# Patient Record
Sex: Female | Born: 1990 | Race: Black or African American | Hispanic: No | Marital: Married | Smoking: Never smoker
Health system: Southern US, Community
[De-identification: ages and names within clinical notes are randomized; demographics above are authoritative.]

## PROBLEM LIST (undated history)

## (undated) DIAGNOSIS — B009 Herpesviral infection, unspecified: Secondary | ICD-10-CM

## (undated) HISTORY — PX: INDUCED ABORTION: SHX677

---

## 2014-08-06 ENCOUNTER — Ambulatory Visit (INDEPENDENT_AMBULATORY_CARE_PROVIDER_SITE_OTHER): Payer: BC Managed Care – PPO | Admitting: Family Medicine

## 2014-08-06 VITALS — BP 104/72 | HR 85 | Temp 98.9°F | Resp 17 | Ht 63.0 in | Wt 117.0 lb

## 2014-08-06 DIAGNOSIS — N760 Acute vaginitis: Secondary | ICD-10-CM

## 2014-08-06 LAB — POCT WET PREP WITH KOH
KOH Prep POC: NEGATIVE
Trichomonas, UA: NEGATIVE
Yeast Wet Prep HPF POC: NEGATIVE

## 2014-08-06 MED ORDER — FLUCONAZOLE 150 MG PO TABS: 150.0000 mg | ORAL_TABLET | Freq: Once | ORAL | Status: DC

## 2014-08-06 NOTE — Progress Notes (Signed)
° °  Subjective:    Patient ID: Melanie Huang, female    DOB: [birth year], 23 y.o.   MRN: [id number]  HPI Chief Complaint  Patient presents with   Vaginal Discharge    THINKS SHE HAS A YEAST INFECTION   This chart was scribed for Elvina SidleKurt Lauenstein, MD by Andrew Auaven Small, ED Scribe. This patient was seen in room 4 and the patient's care was started at 8:24 PM.  HPI Comments: Melanie Huang is a 23 y.o. female who presents to the [facility] complaining of a possible yeast infection. Pt reports vaginal irritation and a slight vaginal discharge. Pt is on birth control and denies the chance of being pregnant.  Pt denies dysuria, fever, chills, vaginal burning. Pt does want to an STD screening.   History reviewed. No pertinent past medical history. No Known Allergies Prior to Admission medications   Not on File   Review of Systems  Constitutional: Negative for fever and chills.  Genitourinary: Positive for vaginal discharge. Negative for dysuria, difficulty urinating and vaginal pain.       Vaginal irritation.    Objective:   Physical Exam  Constitutional: She is oriented to person, place, and time. She appears well-developed and well-nourished. No distress.  HENT:  Head: Normocephalic and atraumatic.  Eyes: Conjunctivae and EOM are normal.  Neck: Neck supple.  Cardiovascular: Normal rate.   Pulmonary/Chest: Effort normal.  Musculoskeletal: Normal range of motion.  Neurological: She is alert and oriented to person, place, and time.  Skin: Skin is warm and dry.  Psychiatric: She has a normal mood and affect. Her behavior is normal.  Nursing note and vitals reviewed.  Results for orders placed or performed in visit on 08/06/14  POCT Wet Prep with KOH  Result Value Ref Range   Trichomonas, UA Negative    Clue Cells Wet Prep HPF POC 0-2    Epithelial Wet Prep HPF POC 2-4    Yeast Wet Prep HPF POC neg    Bacteria Wet Prep HPF POC small    RBC Wet Prep HPF POC 0-1    WBC  Wet Prep HPF POC 4-8    KOH Prep POC Negative    Pelvic:  NEFG, lumpy vag discharge, normal cervix without CMT  Vaginitis and vulvovaginitis - Plan: POCT Wet Prep with KOH, GC/Chlamydia Probe Amp, HIV antibody (with reflex), RPR, fluconazole (DIFLUCAN) 150 MG tablet  Signed, Elvina SidleKurt Lauenstein, MD      Assessment & Plan:   This chart was scribed in my presence and reviewed by me personally. Vaginitis and vulvovaginitis - Plan: POCT Wet Prep with KOH, GC/Chlamydia Probe Amp, HIV antibody (with reflex), RPR, fluconazole (DIFLUCAN) 150 MG tablet  Elvina SidleKurt Lauenstein, MD

## 2014-08-07 LAB — RPR

## 2014-08-07 LAB — HIV ANTIBODY (ROUTINE TESTING W REFLEX): HIV 1&2 Ab, 4th Generation: NONREACTIVE

## 2014-08-08 LAB — GC/CHLAMYDIA PROBE AMP
CT Probe RNA: NEGATIVE
GC Probe RNA: NEGATIVE

## 2014-11-13 ENCOUNTER — Ambulatory Visit (INDEPENDENT_AMBULATORY_CARE_PROVIDER_SITE_OTHER): Payer: BC Managed Care – PPO | Admitting: Physician Assistant

## 2014-11-13 VITALS — BP 100/60 | HR 87 | Temp 98.1°F | Resp 18 | Ht 63.75 in | Wt 118.0 lb

## 2014-11-13 DIAGNOSIS — B3731 Acute candidiasis of vulva and vagina: Secondary | ICD-10-CM

## 2014-11-13 DIAGNOSIS — B373 Candidiasis of vulva and vagina: Secondary | ICD-10-CM

## 2014-11-13 DIAGNOSIS — N760 Acute vaginitis: Secondary | ICD-10-CM | POA: Diagnosis not present

## 2014-11-13 DIAGNOSIS — A499 Bacterial infection, unspecified: Secondary | ICD-10-CM | POA: Diagnosis not present

## 2014-11-13 DIAGNOSIS — B9689 Other specified bacterial agents as the cause of diseases classified elsewhere: Secondary | ICD-10-CM

## 2014-11-13 LAB — POCT WET PREP WITH KOH
KOH PREP POC: POSITIVE
Trichomonas, UA: NEGATIVE
Yeast Wet Prep HPF POC: POSITIVE

## 2014-11-13 MED ORDER — FLUCONAZOLE 150 MG PO TABS: 150.0000 mg | ORAL_TABLET | Freq: Once | ORAL | Status: DC

## 2014-11-13 MED ORDER — METRONIDAZOLE 500 MG PO TABS: 500.0000 mg | ORAL_TABLET | Freq: Three times a day (TID) | ORAL | Status: DC

## 2014-11-13 NOTE — Progress Notes (Signed)
11/13/2014 at 2:34 PM  [place] Uballe / DOB: [birth year] / MRN: [id number]  The patient  does not have a problem list on file.  SUBJECTIVE  Chief compalaint: Vaginal Itching and Vaginal Discharge   History of present illness: Ms. Vannest is 24 y.o. well appearing female presenting for vaginal irritation which she states is moderate. This started 5 days ago and is improving . Associated symptoms include white discharge and vaginal itching.  She denies odor, abdominal and suprapubic pain. Hydrogen peroxide aggravates her symptoms, and monostat alleviates them. She does report a history of chlamydia in 2008.  She had an yeast infection in December along with a normal STI panel at that time. She takes birth control daily. She denies any new partners since her STI testing in December.      She  has no past medical history on file.    She has a current medication list which includes the following prescription(s): fluconazole.  Ms. Berhow has No Known Allergies. She  reports that she has never smoked. She has never used smokeless tobacco. She reports that she drinks alcohol. She reports that she does not use illicit drugs. She  has no sexual activity history on file. The patient  has no past surgical history on file.  Her family history includes Hypertension in her father.  Review of Systems  Constitutional: Negative.   HENT: Negative.   Eyes: Negative.   Respiratory: Negative.   Cardiovascular: Negative.   Gastrointestinal: Negative for nausea and abdominal pain.  Genitourinary: Negative for dysuria, urgency, frequency and hematuria.  Skin: Positive for itching. Negative for rash.  Neurological: Negative for dizziness.    OBJECTIVE  Her  height is 5' 3.75" (1.619 m) and weight is 118 lb (53.524 kg). Her oral temperature is 98.1 F (36.7 C). Her blood pressure is 100/60 and her pulse is 87. Her respiration is 18 and oxygen saturation is 100%.  The patient's body mass index is 20.42  kg/(m^2).  Physical Exam  Constitutional: She is oriented to person, place, and time. She appears well-developed and well-nourished.  Cardiovascular: Normal rate and regular rhythm.   Respiratory: Effort normal and breath sounds normal.  GI: Soft. Bowel sounds are normal. There is no tenderness.  Genitourinary: Uterus normal.    Cervix exhibits no motion tenderness, no discharge and no friability. Right adnexum displays no mass, no tenderness and no fullness. Left adnexum displays no mass, no tenderness and no fullness.    Neurological: She is alert and oriented to person, place, and time.  Skin: Skin is warm and dry. No pallor.  Psychiatric: She has a normal mood and affect.    Results for orders placed or performed in visit on 11/13/14 (from the past 24 hour(s))  POCT Wet Prep with KOH     Status: None   Collection Time: 11/13/14  2:25 PM  Result Value Ref Range   Trichomonas, UA Negative    Clue Cells Wet Prep HPF POC 3-6    Epithelial Wet Prep HPF POC 16-24    Yeast Wet Prep HPF POC pos    Bacteria Wet Prep HPF POC 4+    RBC Wet Prep HPF POC 3-8    WBC Wet Prep HPF POC 1-4    KOH Prep POC Positive     ASSESSMENT & PLAN  [place] was seen today for vaginal itching and vaginal discharge.  Diagnoses and all orders for this visit:  Vaginitis and vulvovaginitis: Patient positive for both yeast and BV.  Will treat with the below.  Will hold on STI testing for now given normal results on 08/06/14.  Patient will return if her symptoms do not improve with the treatment plan.   Orders: -     POCT Wet Prep with KOH  Vaginal yeast infection Orders: -     fluconazole (DIFLUCAN) 150 MG tablet; Take 1 tablet (150 mg total) by mouth once. Repeat if needed  BV (bacterial vaginosis) Orders: -     metroNIDAZOLE (FLAGYL) 500 MG tablet; Take 1 tablet (500 mg total) by mouth 3 (three) times daily.    The patient was advised to call or come back to clinic if she does not see an  improvement in symptoms, or worsens with the above plan.   Deliah BostonMichael Clark, MHS, PA-C [facility] 11/13/2014 2:34 PM

## 2015-03-07 ENCOUNTER — Ambulatory Visit (INDEPENDENT_AMBULATORY_CARE_PROVIDER_SITE_OTHER): Payer: BC Managed Care – PPO | Admitting: Emergency Medicine

## 2015-03-07 VITALS — BP 118/60 | HR 83 | Temp 98.3°F | Resp 18 | Ht 63.5 in | Wt 118.6 lb

## 2015-03-07 DIAGNOSIS — A499 Bacterial infection, unspecified: Secondary | ICD-10-CM

## 2015-03-07 DIAGNOSIS — N898 Other specified noninflammatory disorders of vagina: Secondary | ICD-10-CM

## 2015-03-07 DIAGNOSIS — N760 Acute vaginitis: Secondary | ICD-10-CM | POA: Diagnosis not present

## 2015-03-07 DIAGNOSIS — B9689 Other specified bacterial agents as the cause of diseases classified elsewhere: Secondary | ICD-10-CM

## 2015-03-07 LAB — POCT WET PREP WITH KOH
KOH Prep POC: NEGATIVE
TRICHOMONAS UA: NEGATIVE
YEAST WET PREP PER HPF POC: NEGATIVE

## 2015-03-07 MED ORDER — METRONIDAZOLE 0.75 % VA GEL: 1.0000 | Freq: Two times a day (BID) | VAGINAL | Status: DC

## 2015-03-07 NOTE — Patient Instructions (Signed)
Bacterial Vaginosis Bacterial vaginosis is a vaginal infection that occurs when the normal balance of bacteria in the vagina is disrupted. It results from an overgrowth of certain bacteria. This is the most common vaginal infection in women of childbearing age. Treatment is important to prevent complications, especially in pregnant women, as it can cause a premature delivery. CAUSES  Bacterial vaginosis is caused by an increase in harmful bacteria that are normally present in smaller amounts in the vagina. Several different kinds of bacteria can cause bacterial vaginosis. However, the reason that the condition develops is not fully understood. RISK FACTORS Certain activities or behaviors can put you at an increased risk of developing bacterial vaginosis, including:  Having a new sex partner or multiple sex partners.  Douching.  Using an intrauterine device (IUD) for contraception. Women do not get bacterial vaginosis from toilet seats, bedding, swimming pools, or contact with objects around them. SIGNS AND SYMPTOMS  Some women with bacterial vaginosis have no signs or symptoms. Common symptoms include:  Grey vaginal discharge.  A fishlike odor with discharge, especially after sexual intercourse.  Itching or burning of the vagina and vulva.  Burning or pain with urination. DIAGNOSIS  Your health care provider will take a medical history and examine the vagina for signs of bacterial vaginosis. A sample of vaginal fluid may be taken. Your health care provider will look at this sample under a microscope to check for bacteria and abnormal cells. A vaginal pH test may also be done.  TREATMENT  Bacterial vaginosis may be treated with antibiotic medicines. These may be given in the form of a pill or a vaginal cream. A second round of antibiotics may be prescribed if the condition comes back after treatment.  HOME CARE INSTRUCTIONS   Only take over-the-counter or prescription medicines as  directed by your health care provider.  If antibiotic medicine was prescribed, take it as directed. Make sure you finish it even if you start to feel better.  Do not have sex until treatment is completed.  Tell all sexual partners that you have a vaginal infection. They should see their health care provider and be treated if they have problems, such as a mild rash or itching.  Practice safe sex by using condoms and only having one sex partner. SEEK MEDICAL CARE IF:   Your symptoms are not improving after 3 days of treatment.  You have increased discharge or pain.  You have a fever. MAKE SURE YOU:   Understand these instructions.  Will watch your condition.  Will get help right away if you are not doing well or get worse. FOR MORE INFORMATION  Centers for Disease Control and Prevention, Division of STD Prevention: www.cdc.gov/std American Sexual Health Association (ASHA): www.ashastd.org  Document Released: 08/17/2005 Document Revised: 06/07/2013 Document Reviewed: 03/29/2013 ExitCare Patient Information 2015 [employer]. This information is not intended to replace advice given to you by your health care provider. Make sure you discuss any questions you have with your health care provider.  

## 2015-03-07 NOTE — Progress Notes (Signed)
Subjective:  Patient ID: Melanie Huang, female    DOB: 1991/05/20  Age: 24 y.o. MRN: [id number]  CC: Vaginitis and Vaginal Discharge   HPI Melanie Huang presents  Vaginal discharge that she describes smelling like her. Never goes away. She denies any fever chills. No dysuria urgency or frequency. No dyspareunia. She has no nausea vomiting or fever or chills. No improvement with over-the-counter medication. She's had prior episodes  bacterial vaginosis.  History Melanie has no past medical history on file.   She has no past surgical history on file.   Her  family history includes Hypertension in her father.  She   reports that she has never smoked. She has never used smokeless tobacco. She reports that she drinks alcohol. She reports that she does not use illicit drugs.  Outpatient Prescriptions Prior to Visit  Medication Sig Dispense Refill  . fluconazole (DIFLUCAN) 150 MG tablet Take 1 tablet (150 mg total) by mouth once. Repeat if needed 2 tablet 0  . metroNIDAZOLE (FLAGYL) 500 MG tablet Take 1 tablet (500 mg total) by mouth 3 (three) times daily. 21 tablet 0   No facility-administered medications prior to visit.    History   Social History  . Marital Status: Single    Spouse Name: N/A  . Number of Children: N/A  . Years of Education: N/A   Social History Main Topics  . Smoking status: Never Smoker   . Smokeless tobacco: Never Used  . Alcohol Use: 0.0 oz/week    0 Standard drinks or equivalent per week     Comment: socially  . Drug Use: No  . Sexual Activity: Not on file   Other Topics Concern  . None   Social History Narrative     Review of Systems  Constitutional: Negative for fever, chills and appetite change.  HENT: Negative for congestion, ear pain, postnasal drip, sinus pressure and sore throat.   Eyes: Negative for pain and redness.  Respiratory: Negative for cough, shortness of breath and wheezing.   Cardiovascular: Negative for leg swelling.    Gastrointestinal: Negative for nausea, vomiting, abdominal pain, diarrhea, constipation and blood in stool.  Endocrine: Negative for polyuria.  Genitourinary: Positive for vaginal discharge. Negative for dysuria, urgency, frequency and flank pain.  Musculoskeletal: Negative for gait problem.  Skin: Negative for rash.  Neurological: Negative for weakness and headaches.  Psychiatric/Behavioral: Negative for confusion and decreased concentration. The patient is not nervous/anxious.     Objective:  BP 118/60 mmHg  Pulse 83  Temp(Src) 98.3 F (36.8 C) (Oral)  Resp 18  Ht 5' 3.5" (1.613 m)  Wt 118 lb 9.6 oz (53.797 kg)  BMI 20.68 kg/m2  SpO2 90%  LMP 02/20/2015  Physical Exam  Constitutional: She is oriented to person, place, and time. She appears well-developed and well-nourished. No distress.  HENT:  Head: Normocephalic and atraumatic.  Right Ear: External ear normal.  Left Ear: External ear normal.  Nose: Nose normal.  Eyes: Conjunctivae and EOM are normal. Pupils are equal, round, and reactive to light. No scleral icterus.  Neck: Normal range of motion. Neck supple. No tracheal deviation present.  Cardiovascular: Normal rate, regular rhythm and normal heart sounds.   Pulmonary/Chest: Effort normal. No respiratory distress. She has no wheezes. She has no rales.  Abdominal: She exhibits no mass. There is no tenderness. There is no rebound and no guarding.  Musculoskeletal: She exhibits no edema.  Lymphadenopathy:    She has no cervical adenopathy.  Neurological: She  is alert and oriented to person, place, and time. Coordination normal.  Skin: Skin is warm and dry. No rash noted.  Psychiatric: She has a normal mood and affect. Her behavior is normal.      Assessment & Plan:   Melanie Huang was seen today for vaginitis and vaginal discharge.  Diagnoses and all orders for this visit:  BV (bacterial vaginosis)  Vaginal discharge Orders: -     POCT Wet Prep with KOH  Other  orders -     metroNIDAZOLE (METROGEL) 0.75 % vaginal gel; Place 1 Applicatorful vaginally 2 (two) times daily.   I am having Ms. Artman start on metroNIDAZOLE. I am also having her maintain her fluconazole and metroNIDAZOLE.  Meds ordered this encounter  Medications  . metroNIDAZOLE (METROGEL) 0.75 % vaginal gel    Sig: Place 1 Applicatorful vaginally 2 (two) times daily.    Dispense:  70 g    Refill:  0    Appropriate red flag conditions were discussed with the patient as well as actions that should be taken.  Patient expressed his understanding.  Follow-up: Return if symptoms worsen or fail to improve.  Carmelina Dane, MD  Results for orders placed or performed in visit on 03/07/15  POCT Wet Prep with KOH  Result Value Ref Range   Trichomonas, UA Negative    Clue Cells Wet Prep HPF POC 2-5    Epithelial Wet Prep HPF POC Few Few, Moderate, Many   Yeast Wet Prep HPF POC neg    Bacteria Wet Prep HPF POC Moderate (A) Few   RBC Wet Prep HPF POC 0-2    WBC Wet Prep HPF POC 8-12    KOH Prep POC Negative

## 2015-06-19 ENCOUNTER — Ambulatory Visit (INDEPENDENT_AMBULATORY_CARE_PROVIDER_SITE_OTHER): Payer: BC Managed Care – PPO | Admitting: Family Medicine

## 2015-06-19 VITALS — BP 100/62 | HR 74 | Temp 98.1°F | Resp 18 | Ht 63.5 in | Wt 118.0 lb

## 2015-06-19 DIAGNOSIS — A499 Bacterial infection, unspecified: Secondary | ICD-10-CM

## 2015-06-19 DIAGNOSIS — N760 Acute vaginitis: Secondary | ICD-10-CM | POA: Diagnosis not present

## 2015-06-19 DIAGNOSIS — B9689 Other specified bacterial agents as the cause of diseases classified elsewhere: Secondary | ICD-10-CM

## 2015-06-19 DIAGNOSIS — N898 Other specified noninflammatory disorders of vagina: Secondary | ICD-10-CM

## 2015-06-19 LAB — POCT WET + KOH PREP
TRICH BY WET PREP: ABSENT
YEAST BY KOH: ABSENT
Yeast by wet prep: ABSENT

## 2015-06-19 MED ORDER — METRONIDAZOLE 0.75 % VA GEL: 1.0000 | Freq: Every day | VAGINAL | Status: DC

## 2015-06-19 NOTE — Patient Instructions (Signed)

## 2015-06-20 LAB — GC/CHLAMYDIA PROBE AMP
CT PROBE, AMP APTIMA: NEGATIVE
GC PROBE AMP APTIMA: NEGATIVE

## 2015-06-22 ENCOUNTER — Encounter: Payer: Self-pay | Admitting: Family Medicine

## 2015-06-27 NOTE — Progress Notes (Signed)
Subjective:    Patient ID: Melanie Huang, female    DOB: 20-Feb-1991, 24 y.o.   MRN: [id number] Chief Complaint  Patient presents with  . Gynecologic Exam    vaginal odor      HPI  Ha recurrent BV and yest inf - last 3 mos prior - now w/ sxs recurring  History reviewed. No pertinent past medical history. No current outpatient prescriptions on file prior to visit.   No current facility-administered medications on file prior to visit.   No Known Allergies   Review of Systems  Constitutional: Negative for fever, chills, diaphoresis, activity change, appetite change, fatigue and unexpected weight change.  Gastrointestinal: Negative for abdominal pain, diarrhea, constipation, blood in stool, anal bleeding and rectal pain.  Genitourinary: Positive for vaginal discharge and pelvic pain. Negative for dysuria, urgency, frequency, hematuria, decreased urine volume, vaginal bleeding, difficulty urinating, genital sores, vaginal pain, menstrual problem and dyspareunia.  Musculoskeletal: Negative for gait problem.  Skin: Negative for rash.  Hematological: Negative for adenopathy.  Psychiatric/Behavioral: The patient is not nervous/anxious.        Objective:  BP 100/62 mmHg  Pulse 74  Temp(Src) 98.1 F (36.7 C) (Oral)  Resp 18  Ht 5' 3.5" (1.613 m)  Wt 118 lb (53.524 kg)  BMI 20.57 kg/m2  SpO2 99%  LMP 06/10/2015  Physical Exam  Constitutional: She is oriented to person, place, and time. She appears well-developed and well-nourished. No distress.  HENT:  Head: Normocephalic and atraumatic.  Cardiovascular: Normal rate, regular rhythm, normal heart sounds and intact distal pulses.   Pulmonary/Chest: Effort normal and breath sounds normal.  Abdominal: Soft. Bowel sounds are normal. She exhibits no distension. There is no tenderness. There is no rebound and no guarding.  Genitourinary: Uterus normal. Pelvic exam was performed with patient supine. There is no rash, tenderness or lesion  on the right labia. There is no rash, tenderness or lesion on the left labia. Cervix exhibits no motion tenderness and no friability. Right adnexum displays no mass, no tenderness and no fullness. Left adnexum displays no mass, no tenderness and no fullness. No erythema or tenderness in the vagina. Vaginal discharge found.  Lymphadenopathy:       Right: No inguinal adenopathy present.       Left: No inguinal adenopathy present.  Neurological: She is alert and oriented to person, place, and time.  Skin: Skin is warm and dry. She is not diaphoretic.  Psychiatric: She has a normal mood and affect. Her behavior is normal.          Assessment & Plan:   1. Vaginal discharge   2. Bacterial vaginosis     Orders Placed This Encounter  Procedures  . GC/Chlamydia Probe Amp  . POCT Wet + KOH Prep ([facility])    Meds ordered this encounter  Medications  . metroNIDAZOLE (METROGEL) 0.75 % vaginal gel    Sig: Place 1 Applicatorful vaginally at bedtime. x5d    Dispense:  70 g    Refill:  0    Norberto Sorenson, MD MPH  Results for orders placed or performed in visit on 06/19/15  GC/Chlamydia Probe Amp  Result Value Ref Range   CT Probe RNA NEGATIVE    GC Probe RNA NEGATIVE   POCT Wet + KOH Prep ([facility])  Result Value Ref Range   Yeast by KOH Absent Present, Absent   Yeast by wet prep Absent Present, Absent   WBC by wet prep Moderate (A) None, Few  Clue Cells Wet Prep HPF POC Many (A) None   Trich by wet prep Absent Present, Absent   Bacteria Wet Prep HPF POC Many (A) None, Few   Epithelial Cells By Principal FinancialWet Pref ([facility]) None None, Few   RBC,UR,HPF,POC Few (A) None RBC/hpf

## 2015-09-02 ENCOUNTER — Ambulatory Visit (INDEPENDENT_AMBULATORY_CARE_PROVIDER_SITE_OTHER): Payer: BC Managed Care – PPO | Admitting: Family Medicine

## 2015-09-02 VITALS — BP 112/70 | HR 88 | Temp 97.2°F | Resp 18 | Ht 64.25 in | Wt 123.0 lb

## 2015-09-02 DIAGNOSIS — N3000 Acute cystitis without hematuria: Secondary | ICD-10-CM | POA: Diagnosis not present

## 2015-09-02 DIAGNOSIS — R309 Painful micturition, unspecified: Secondary | ICD-10-CM

## 2015-09-02 LAB — POC MICROSCOPIC URINALYSIS (UMFC)

## 2015-09-02 LAB — POCT URINALYSIS DIP (MANUAL ENTRY)
BILIRUBIN UA: NEGATIVE
Glucose, UA: NEGATIVE
Ketones, POC UA: NEGATIVE
NITRITE UA: POSITIVE — AB
PH UA: 6
Protein Ur, POC: >=300 — AB
Spec Grav, UA: >=1.03
UROBILINOGEN UA: 0.2

## 2015-09-02 MED ORDER — FLUCONAZOLE 150 MG PO TABS: 150.0000 mg | ORAL_TABLET | Freq: Once | ORAL | Status: DC

## 2015-09-02 MED ORDER — NITROFURANTOIN MONOHYD MACRO 100 MG PO CAPS: 100.0000 mg | ORAL_CAPSULE | Freq: Two times a day (BID) | ORAL | Status: DC

## 2015-09-02 NOTE — Patient Instructions (Signed)
We are going to treat you for a UTI and possible yeast infection with macrobid and diflucan I will be in touch if your urine culture shows any problems.  Let me know if you do not feel better in the next couple of days- Sooner if worse.

## 2015-09-02 NOTE — Progress Notes (Addendum)
[facility] [address], [place] [address] [phone]  Date:  09/02/2015   Name:  Melanie Huang   DOB:  08/07/1991   MRN:  [id number]  PCP:  No PCP Per Patient    Chief Complaint: Urinary Tract Infection   History of Present Illness:  Melanie Huang is a 25 y.o. very pleasant female patient who presents with the following:  Here today with complaint of typical UTI sx since yesterday . She has noted discomfort with urination, no blood in her urine.   No back pain, belly pain, fever or vomiting.   She did notice some vaginal itching and used a monistat pack yesterday.    She is generally quite healthy   LMP was 12/6  There are no active problems to display for this patient.   History reviewed. No pertinent past medical history.  Past Surgical History  Procedure Laterality Date  . Induced abortion      Social History  Substance Use Topics  . Smoking status: Never Smoker   . Smokeless tobacco: Never Used  . Alcohol Use: 0.0 oz/week    0 Standard drinks or equivalent per week     Comment: socially    Family History  Problem Relation Age of Onset  . Hypertension Father     No Known Allergies  Medication list has been reviewed and updated.  Current Outpatient Prescriptions on File Prior to Visit  Medication Sig Dispense Refill  . metroNIDAZOLE (METROGEL) 0.75 % vaginal gel Place 1 Applicatorful vaginally at bedtime. x5d (Patient not taking: Reported on 09/02/2015) 70 g 0   No current facility-administered medications on file prior to visit.    Review of Systems:  As per HPI- otherwise negative.   Physical Examination: Filed Vitals:   09/02/15 1213  BP: 112/70  Pulse: 88  Temp: 97.2 F (36.2 C)  Resp: 18   Filed Vitals:   09/02/15 1213  Height: 5' 4.25" (1.632 m)  Weight: 123 lb (55.792 kg)   Body mass index is 20.95 kg/(m^2). Ideal Body Weight: Weight in (lb) to have BMI = 25: 146.5  GEN: WDWN, NAD, Non-toxic, A & O x 3,  normal weight, looks well HEENT: Atraumatic, Normocephalic. Neck supple. No masses, No LAD. Ears and Nose: No external deformity. CV: RRR, No M/G/R. No JVD. No thrill. No extra heart sounds. PULM: CTA B, no wheezes, crackles, rhonchi. No retractions. No resp. distress. No accessory muscle use. ABD: S, NT, ND. No rebound. No HSM.  No CVA tenderness, benign belly EXTR: No c/c/e NEURO Normal gait.  PSYCH: Normally interactive. Conversant. Not depressed or anxious appearing.  Calm demeanor.    Assessment and Plan: Acute cystitis without hematuria - Plan: Urine culture, nitrofurantoin, macrocrystal-monohydrate, (MACROBID) 100 MG capsule, fluconazole (DIFLUCAN) 150 MG tablet  Urinary pain - Plan: POCT urinalysis dipstick, POCT Microscopic Urinalysis ([facility])  Treat for a UTI with macrobid, also diflucan for potential yeast infection from abx per her request.  She will let me know if not feeling better soon  Signed Abbe AmsterdamJessica Kapena Hamme, MD  Received urine culture results 1/4: e coli sensitive to macrobid.  Called and LMOM- let me know if not better

## 2015-09-04 LAB — URINE CULTURE

## 2015-11-06 ENCOUNTER — Ambulatory Visit (INDEPENDENT_AMBULATORY_CARE_PROVIDER_SITE_OTHER): Payer: BC Managed Care – PPO | Admitting: Physician Assistant

## 2015-11-06 VITALS — BP 108/60 | HR 101 | Temp 99.2°F | Resp 16 | Ht 64.0 in | Wt 119.8 lb

## 2015-11-06 DIAGNOSIS — A499 Bacterial infection, unspecified: Secondary | ICD-10-CM

## 2015-11-06 DIAGNOSIS — B379 Candidiasis, unspecified: Secondary | ICD-10-CM

## 2015-11-06 DIAGNOSIS — N898 Other specified noninflammatory disorders of vagina: Secondary | ICD-10-CM

## 2015-11-06 DIAGNOSIS — N760 Acute vaginitis: Secondary | ICD-10-CM | POA: Diagnosis not present

## 2015-11-06 DIAGNOSIS — T3695XA Adverse effect of unspecified systemic antibiotic, initial encounter: Secondary | ICD-10-CM

## 2015-11-06 DIAGNOSIS — B9689 Other specified bacterial agents as the cause of diseases classified elsewhere: Secondary | ICD-10-CM

## 2015-11-06 LAB — POCT URINALYSIS DIP (MANUAL ENTRY)
BILIRUBIN UA: NEGATIVE
GLUCOSE UA: NEGATIVE
Leukocytes, UA: NEGATIVE
Nitrite, UA: NEGATIVE
PH UA: 7
Protein Ur, POC: NEGATIVE
RBC UA: NEGATIVE
SPEC GRAV UA: 1.015
Urobilinogen, UA: 0.2

## 2015-11-06 LAB — POCT WET + KOH PREP
TRICH BY WET PREP: ABSENT
Yeast by KOH: ABSENT
Yeast by wet prep: ABSENT

## 2015-11-06 LAB — POC MICROSCOPIC URINALYSIS (UMFC)

## 2015-11-06 LAB — POCT URINE PREGNANCY: PREG TEST UR: NEGATIVE

## 2015-11-06 MED ORDER — FLUCONAZOLE 150 MG PO TABS: 150.0000 mg | ORAL_TABLET | Freq: Once | ORAL | Status: DC

## 2015-11-06 MED ORDER — METRONIDAZOLE 500 MG PO TABS: 500.0000 mg | ORAL_TABLET | Freq: Two times a day (BID) | ORAL | Status: DC

## 2015-11-06 NOTE — Patient Instructions (Addendum)
Because you received labwork today, you will receive an invoice from [facility]. Please contact [facility] at [phone] with questions or concerns regarding your invoice. Our billing staff will not be able to assist you with those questions.  You will be contacted with the lab results as soon as they are available. The fastest way to get your results is to activate your My Chart account. Instructions are located on the last page of this paperwork. If you have not heard from us regarding the results in 2 weeks, please contact this office. Bacterial Vaginosis Bacterial vaginosis is a vaginal infection that occurs when the normal balance of bacteria in the vagina is disrupted. It results from an overgrowth of certain bacteria. This is the most common vaginal infection in women of childbearing age. Treatment is important to prevent complications, especially in pregnant women, as it can cause a premature delivery. CAUSES  Bacterial vaginosis is caused by an increase in harmful bacteria that are normally present in smaller amounts in the vagina. Several different kinds of bacteria can cause bacterial vaginosis. However, the reason that the condition develops is not fully understood. RISK FACTORS Certain activities or behaviors can put you at an increased risk of developing bacterial vaginosis, including:  Having a new sex partner or multiple sex partners.  Douching.  Using an intrauterine device (IUD) for contraception. Women do not get bacterial vaginosis from toilet seats, bedding, swimming pools, or contact with objects around them. SIGNS AND SYMPTOMS  Some women with bacterial vaginosis have no signs or symptoms. Common symptoms include:  Grey vaginal discharge.  A fishlike odor with discharge, especially after sexual intercourse.  Itching or burning of the vagina and vulva.  Burning or pain with urination. DIAGNOSIS  Your health care provider will take a medical  history and examine the vagina for signs of bacterial vaginosis. A sample of vaginal fluid may be taken. Your health care provider will look at this sample under a microscope to check for bacteria and abnormal cells. A vaginal pH test may also be done.  TREATMENT  Bacterial vaginosis may be treated with antibiotic medicines. These may be given in the form of a pill or a vaginal cream. A second round of antibiotics may be prescribed if the condition comes back after treatment. Because bacterial vaginosis increases your risk for sexually transmitted diseases, getting treated can help reduce your risk for chlamydia, gonorrhea, HIV, and herpes. HOME CARE INSTRUCTIONS   Only take over-the-counter or prescription medicines as directed by your health care provider.  If antibiotic medicine was prescribed, take it as directed. Make sure you finish it even if you start to feel better.  Tell all sexual partners that you have a vaginal infection. They should see their health care provider and be treated if they have problems, such as a mild rash or itching.  During treatment, it is important that you follow these instructions:  Avoid sexual activity or use condoms correctly.  Do not douche.  Avoid alcohol as directed by your health care provider.  Avoid breastfeeding as directed by your health care provider. SEEK MEDICAL CARE IF:   Your symptoms are not improving after 3 days of treatment.  You have increased discharge or pain.  You have a fever. MAKE SURE YOU:   Understand these instructions.  Will watch your condition.  Will get help right away if you are not doing well or get worse. FOR MORE INFORMATION  Centers for Disease Control and Prevention, Division of  STD Prevention: [employer] American Sexual Health Association (ASHA): www.ashastd.org    This information is not intended to replace advice given to you by your health care provider. Make sure you discuss any questions you have  with your health care provider.   Document Released: 08/17/2005 Document Revised: 09/07/2014 Document Reviewed: 03/29/2013 Elsevier Interactive Patient Education [employer]

## 2015-11-06 NOTE — Progress Notes (Signed)
[facility] [address], [place] [address] [phone]  Date:  11/06/2015   Name:  Melanie Huang   DOB:  [birth year]   MRN:  [id number]  PCP:  No PCP Per Patient    History of Present Illness:  Melanie Lowman is a 25 y.o. female patient who presents to [facility] for cc of vaginal discharge.  -2 days of vaginal odor--no abdominal pain.  Light colored blood.  No urinary pain, hematuria, or frequency.  Sexually active with unprotection.  Has missed 2 days worth of ocp within the last 2 weeks.   -Switched soaps. -Patient's last menstrual period was 10/31/2015.   There are no active problems to display for this patient.   History reviewed. No pertinent past medical history.  Past Surgical History  Procedure Laterality Date  . Induced abortion      Social History  Substance Use Topics  . Smoking status: Never Smoker   . Smokeless tobacco: Never Used  . Alcohol Use: 0.0 oz/week    0 Standard drinks or equivalent per week     Comment: socially    Family History  Problem Relation Age of Onset  . Hypertension Father     No Known Allergies  Medication list has been reviewed and updated.  Current Outpatient Prescriptions on File Prior to Visit  Medication Sig Dispense Refill  . fluconazole (DIFLUCAN) 150 MG tablet Take 1 tablet (150 mg total) by mouth once. Repeat in one week if needed (Patient not taking: Reported on 11/06/2015) 2 tablet 0  . metroNIDAZOLE (METROGEL) 0.75 % vaginal gel Place 1 Applicatorful vaginally at bedtime. x5d (Patient not taking: Reported on 09/02/2015) 70 g 0  . nitrofurantoin, macrocrystal-monohydrate, (MACROBID) 100 MG capsule Take 1 capsule (100 mg total) by mouth 2 (two) times daily. (Patient not taking: Reported on 11/06/2015) 14 capsule 0   No current facility-administered medications on file prior to visit.    ROS ROS otherwise unremarkable unless listed above.   Physical Examination: BP 108/60 mmHg  Pulse 101  Temp(Src) 99.2  F (37.3 C) (Oral)  Resp 16  Ht  (1.626 m)  Wt 119 lb 12.8 oz (54.341 kg)  BMI 20.55 kg/m2  SpO2 99%  LMP 10/31/2015 Ideal Body Weight: Weight in (lb) to have BMI = 25: 145.3  Physical Exam  Constitutional: She is oriented to person, place, and time. She appears well-developed and well-nourished. No distress.  HENT:  Head: Normocephalic and atraumatic.  Right Ear: External ear normal.  Left Ear: External ear normal.  Eyes: Conjunctivae and EOM are normal. Pupils are equal, round, and reactive to light.  Cardiovascular: Normal rate.   Pulmonary/Chest: Effort normal. No respiratory distress.  Genitourinary: Pelvic exam was performed with patient supine. There is no rash on the right labia. There is no rash on the left labia. Cervix exhibits no discharge and no friability. Vaginal discharge found.  Neurological: She is alert and oriented to person, place, and time.  Skin: She is not diaphoretic.  Psychiatric: She has a normal mood and affect. Her behavior is normal.    Results for orders placed or performed in visit on 11/06/15  POCT urinalysis dipstick  Result Value Ref Range   Color, UA yellow yellow   Clarity, UA clear clear   Glucose, UA negative negative   Bilirubin, UA negative negative   Ketones, POC UA small (15) (A) negative   Spec Grav, UA 1.015    Blood, UA negative negative  pH, UA 7.0    Protein Ur, POC negative negative   Urobilinogen, UA 0.2    Nitrite, UA Negative Negative   Leukocytes, UA Negative Negative  POCT Microscopic Urinalysis ([facility])  Result Value Ref Range   WBC,UR,HPF,POC None None WBC/hpf   RBC,UR,HPF,POC None None RBC/hpf   Bacteria Few (A) None, Too numerous to count   Mucus Present (A) Absent   Epithelial Cells, UR Per Microscopy Many (A) None, Too numerous to count cells/hpf  POCT urine pregnancy  Result Value Ref Range   Preg Test, Ur Negative Negative     Assessment and Plan: [place] Katrinka BlazingSmith is a 25 y.o. female who is here today  for vaginal discharge.   Bacterial vaginosis - Plan: metroNIDAZOLE (FLAGYL) 500 MG tablet  Vaginal discharge - Plan: POCT urinalysis dipstick, POCT Microscopic Urinalysis ([facility]), POCT urine pregnancy, GC/Chlamydia Probe Amp, POCT Wet + KOH Prep, metroNIDAZOLE (FLAGYL) 500 MG tablet, CANCELED: POCT Microscopic Urinalysis ([facility]), CANCELED: POCT urinalysis dipstick  Antibiotic-induced yeast infection - Plan: fluconazole (DIFLUCAN) 150 MG tablet  Trena PlattStephanie English, PA-C [facility] 3/19/201710:19 PM   Trena PlattStephanie English, PA-C [facility] 11/06/2015 1:50 PM

## 2015-11-07 LAB — GC/CHLAMYDIA PROBE AMP
CT Probe RNA: NOT DETECTED
GC Probe RNA: NOT DETECTED

## 2015-11-17 ENCOUNTER — Encounter: Payer: Self-pay | Admitting: Physician Assistant

## 2016-06-06 LAB — OB RESULTS CONSOLE GBS: GBS: NEGATIVE

## 2016-06-06 LAB — OB RESULTS CONSOLE GC/CHLAMYDIA
Chlamydia: NEGATIVE
GC PROBE AMP, GENITAL: NEGATIVE

## 2016-06-06 LAB — OB RESULTS CONSOLE HEPATITIS B SURFACE ANTIGEN: HEP B S AG: NEGATIVE

## 2016-06-06 LAB — OB RESULTS CONSOLE ABO/RH: RH TYPE: POSITIVE

## 2016-06-06 LAB — OB RESULTS CONSOLE HIV ANTIBODY (ROUTINE TESTING): HIV: NONREACTIVE

## 2016-06-06 LAB — OB RESULTS CONSOLE RUBELLA ANTIBODY, IGM: RUBELLA: IMMUNE

## 2016-06-06 LAB — OB RESULTS CONSOLE RPR: RPR: NONREACTIVE

## 2016-08-27 ENCOUNTER — Other Ambulatory Visit (HOSPITAL_COMMUNITY): Payer: Self-pay | Admitting: Obstetrics and Gynecology

## 2016-08-27 DIAGNOSIS — O35EXX0 Maternal care for other (suspected) fetal abnormality and damage, fetal genitourinary anomalies, not applicable or unspecified: Secondary | ICD-10-CM

## 2016-08-27 DIAGNOSIS — Z3A19 19 weeks gestation of pregnancy: Secondary | ICD-10-CM

## 2016-08-27 DIAGNOSIS — O283 Abnormal ultrasonic finding on antenatal screening of mother: Secondary | ICD-10-CM

## 2016-08-27 DIAGNOSIS — Z3689 Encounter for other specified antenatal screening: Secondary | ICD-10-CM

## 2016-08-27 DIAGNOSIS — O358XX0 Maternal care for other (suspected) fetal abnormality and damage, not applicable or unspecified: Secondary | ICD-10-CM

## 2016-09-03 ENCOUNTER — Ambulatory Visit (HOSPITAL_COMMUNITY)
Admission: RE | Admit: 2016-09-03 | Discharge: 2016-09-03 | Disposition: A | Discharge status: Home / Self Care | Payer: BC Managed Care – PPO | Source: Ambulatory Visit | Attending: Obstetrics and Gynecology | Admitting: Obstetrics and Gynecology

## 2016-09-03 ENCOUNTER — Ambulatory Visit (HOSPITAL_COMMUNITY): Payer: BC Managed Care – PPO

## 2016-09-03 ENCOUNTER — Encounter (HOSPITAL_COMMUNITY): Payer: Self-pay

## 2016-09-03 ENCOUNTER — Other Ambulatory Visit (HOSPITAL_COMMUNITY): Payer: Self-pay | Admitting: Obstetrics and Gynecology

## 2016-09-03 DIAGNOSIS — Z315 Encounter for genetic counseling: Secondary | ICD-10-CM | POA: Insufficient documentation

## 2016-09-03 DIAGNOSIS — Z3A19 19 weeks gestation of pregnancy: Secondary | ICD-10-CM | POA: Insufficient documentation

## 2016-09-03 DIAGNOSIS — O35EXX0 Maternal care for other (suspected) fetal abnormality and damage, fetal genitourinary anomalies, not applicable or unspecified: Secondary | ICD-10-CM

## 2016-09-03 DIAGNOSIS — O358XX0 Maternal care for other (suspected) fetal abnormality and damage, not applicable or unspecified: Secondary | ICD-10-CM

## 2016-09-03 DIAGNOSIS — Z363 Encounter for antenatal screening for malformations: Secondary | ICD-10-CM | POA: Diagnosis not present

## 2016-09-03 DIAGNOSIS — Z3689 Encounter for other specified antenatal screening: Secondary | ICD-10-CM

## 2016-09-03 DIAGNOSIS — O283 Abnormal ultrasonic finding on antenatal screening of mother: Secondary | ICD-10-CM

## 2016-09-03 HISTORY — DX: Herpesviral infection, unspecified: B00.9

## 2016-09-03 NOTE — Progress Notes (Signed)
Genetic Counseling  High-Risk Gestation Note  Appointment Date:  09/03/2016 Referred By: Harold Hedgeomblin, James, MD Date of Birth:  08-03-1991 Partner:  Elita Booneishan Schlarb   Pregnancy History: [id number] Estimated Date of Delivery: 01/22/17 Estimated Gestational Age: 6040w6d Attending: Alpha GulaPaul Whitecar, MD   Mrs. Melanie Huang was seen for genetic counseling because of abnormal ultrasound findings.    In summary:  Reviewed ultrasound findings and the association with fetal aneuploidy  Bilateral urinary tract dilation (Pyelectasis), absent nasal bone, and echogenic intracardiac focus visualized today  Reviewed age-related risk for Down syndrome  Soft markers on ultrasound would adjust age-related risk to approximately 5%  Offered additional screening  NIPS- declined  Discussed option of diagnostic testing  Amniocentesis-declined  Reviewed ACOG carrier screening options-declined  Reviewed family history concerns  Ultrasound performed today revealed an echogenic intracardiac focus (EIF), bilateral renal pyelectasis (urinary tract dilation), and absent fetal nasal bone.  The ultrasound report will be sent under separate cover.  We discussed that the second trimester genetic sonogram is targeted at identifying features associated with aneuploidy.  It has evolved as a screening tool used to provide an individualized risk assessment for Down syndrome and other trisomies.  The ability of sonography to aid in the detection of aneuploidies relies on identification of both major structural anomalies and "soft markers."  The patient was counseled that the latter term refers to findings that are often normal variants and do not cause any significant medical problems.  Nonetheless, these markers have a known association with aneuploidy.    The patient was counseled that an EIF is characterized by calcified papillary muscle leading to a discreet dot in the left, or less commonly, the right ventricle.  We discussed that  this finding is typically considered to be a benign variant; however, the risk of aneuploidy is increased when this marker is found in patients who have additional risk factors for fetal aneuploidy (AMA, abnormal screening test, or other markers or anomalies by fetal ultrasound).   We then discussed that urinary tract dilation (pyelectasis) is identified when the antero-posterior diameter of either renal pelvis exceeds > 4 mm in the second trimester.  We reviewed that this marker is more common in males and is considered a benign variant in fetuses who are determined to have no other risk factors for fetal aneuploidy.  Since this finding can progress to congenital hydronephrosis, follow up ultrasounds are recommended.  Melanie Huang is scheduled to return for a follow-up ultrasound in 4 weeks.    Melanie Huang was counseled that an absent nasal bone is a highly sensitive and specific marker for Down syndrome.  It is present in approximately 1% of chromosomally normal fetuses, but up to 70% of fetuses with Down syndrome, 55 % with trisomy 18, and 34% with trisomy 13. The presence and length of the fetal nasal bone varies by race and ethnicity in second trimester fetuses.  The associated risk of aneuploidy is highest in individuals with European ancestry, with a likelihood ratio of approximately 50; in African Americans the likelihood ratio is approximately 8.     We reviewed chromosomes, nondisjunction, and the common features and variable prognosis of Down syndrome.  We also reviewed other aneuploidies including trisomies 13 and 118; however, we discussed that these conditions are less likely given that the remainder of the fetal anatomy was within normal limits by ultrasound.  We reviewed that [name]'Huang risk to have a fetus with Down syndrome is ~1 in 800, based on  her age of 26 y.o. at [redacted]w[redacted]d weeks gestation.  Considering the combined ultrasound findings of an EIF, pyelectasis, and absent fetal  nasal bone, the adjusted risk for fetal Down syndrome is ~5-10%.   We reviewed other available screening and diagnostic options including noninvasive prenatal screening (NIPS)/cell free DNA (cfDNA) screening and amniocentesis.  She was counseled regarding the benefits and limitations of each option. We reviewed the methodology of NIPS, the conditions for which it assesses, detection rates and false positive rates of each.  We reviewed the approximate 1 in 300-500 risk for complications for amniocentesis, including spontaneous pregnancy loss. We discussed the possible results that the tests might provide including: positive, negative, unanticipated, and no result. Finally, they were counseled regarding the cost of each option and potential out of pocket expenses. After consideration of all the options, she declined NIPS and amniocentesis. Melanie Huang indicated that a prenatal diagnosis of Down syndrome would not likely alter the pregnancy course for her and could potentially increase worry for her. She understands that ultrasound cannot diagnose or rule out chromosome conditions and cannot diagnose or rule out all birth defects.   Melanie Huang was provided with written information regarding cystic fibrosis (CF), spinal muscular atrophy (SMA) and hemoglobinopathies including the carrier frequency, availability of carrier screening and prenatal diagnosis if indicated.  In addition, we discussed that CF and hemoglobinopathies are routinely screened for as part of the [place] newborn screening panel. She previously had hemoglobin electrophoresis through her OB provider, which was within normal limits.  She declined screening for CF and SMA.  Both family histories were reviewed and found to be noncontributory for birth defects, intellectual disability, and known genetic conditions. Without further information regarding the provided family history, an accurate genetic risk cannot be calculated. Further genetic  counseling is warranted if more information is obtained.  Mrs. [place] Spranger denied exposure to environmental toxins or chemical agents. She denied the use of alcohol, tobacco or street drugs. She denied significant viral illnesses during the course of her pregnancy. Her medical and surgical histories were noncontributory.   I counseled Mrs. [place] Dib regarding the above risks and available options.  The approximate face-to-face time with the genetic counselor was 40 minutes.     Quinn Plowman, MS Certified Genetic Counselor 09/03/2016

## 2016-09-04 ENCOUNTER — Other Ambulatory Visit (HOSPITAL_COMMUNITY): Payer: Self-pay | Admitting: *Deleted

## 2016-09-04 ENCOUNTER — Other Ambulatory Visit (HOSPITAL_COMMUNITY): Payer: Self-pay

## 2016-09-04 DIAGNOSIS — O35EXX0 Maternal care for other (suspected) fetal abnormality and damage, fetal genitourinary anomalies, not applicable or unspecified: Secondary | ICD-10-CM

## 2016-09-04 DIAGNOSIS — O358XX0 Maternal care for other (suspected) fetal abnormality and damage, not applicable or unspecified: Secondary | ICD-10-CM

## 2016-10-01 ENCOUNTER — Other Ambulatory Visit (HOSPITAL_COMMUNITY): Payer: Self-pay | Admitting: Maternal and Fetal Medicine

## 2016-10-01 ENCOUNTER — Ambulatory Visit (HOSPITAL_COMMUNITY)
Admission: RE | Admit: 2016-10-01 | Discharge: 2016-10-01 | Disposition: A | Discharge status: Home / Self Care | Payer: BC Managed Care – PPO | Source: Ambulatory Visit | Attending: Obstetrics and Gynecology | Admitting: Obstetrics and Gynecology

## 2016-10-01 ENCOUNTER — Encounter (HOSPITAL_COMMUNITY): Payer: Self-pay

## 2016-10-01 DIAGNOSIS — O35EXX0 Maternal care for other (suspected) fetal abnormality and damage, fetal genitourinary anomalies, not applicable or unspecified: Secondary | ICD-10-CM

## 2016-10-01 DIAGNOSIS — Z3A23 23 weeks gestation of pregnancy: Secondary | ICD-10-CM | POA: Insufficient documentation

## 2016-10-01 DIAGNOSIS — O358XX0 Maternal care for other (suspected) fetal abnormality and damage, not applicable or unspecified: Secondary | ICD-10-CM

## 2016-10-01 DIAGNOSIS — O283 Abnormal ultrasonic finding on antenatal screening of mother: Secondary | ICD-10-CM

## 2016-10-06 ENCOUNTER — Encounter (HOSPITAL_COMMUNITY): Payer: Self-pay

## 2017-01-12 ENCOUNTER — Inpatient Hospital Stay (HOSPITAL_COMMUNITY)
Admission: AD | Admit: 2017-01-12 | Discharge: 2017-01-14 | DRG: 774 | Disposition: A | Discharge status: Home / Self Care | Payer: BC Managed Care – PPO | Source: Ambulatory Visit | Attending: Obstetrics and Gynecology | Admitting: Obstetrics and Gynecology

## 2017-01-12 ENCOUNTER — Inpatient Hospital Stay (HOSPITAL_COMMUNITY): Payer: BC Managed Care – PPO | Admitting: Anesthesiology

## 2017-01-12 ENCOUNTER — Encounter (HOSPITAL_COMMUNITY): Payer: Self-pay

## 2017-01-12 DIAGNOSIS — O358XX0 Maternal care for other (suspected) fetal abnormality and damage, not applicable or unspecified: Principal | ICD-10-CM | POA: Diagnosis present

## 2017-01-12 DIAGNOSIS — A6000 Herpesviral infection of urogenital system, unspecified: Secondary | ICD-10-CM | POA: Diagnosis present

## 2017-01-12 DIAGNOSIS — O9832 Other infections with a predominantly sexual mode of transmission complicating childbirth: Secondary | ICD-10-CM | POA: Diagnosis present

## 2017-01-12 DIAGNOSIS — Z3A38 38 weeks gestation of pregnancy: Secondary | ICD-10-CM | POA: Diagnosis not present

## 2017-01-12 DIAGNOSIS — Z3493 Encounter for supervision of normal pregnancy, unspecified, third trimester: Secondary | ICD-10-CM | POA: Diagnosis present

## 2017-01-12 DIAGNOSIS — Z8249 Family history of ischemic heart disease and other diseases of the circulatory system: Secondary | ICD-10-CM

## 2017-01-12 DIAGNOSIS — O283 Abnormal ultrasonic finding on antenatal screening of mother: Secondary | ICD-10-CM

## 2017-01-12 LAB — CBC
HEMATOCRIT: 34.8 % — AB (ref 36.0–46.0)
Hemoglobin: 11.6 g/dL — ABNORMAL LOW (ref 12.0–15.0)
MCH: 24.1 pg — AB (ref 26.0–34.0)
MCHC: 33.3 g/dL (ref 30.0–36.0)
MCV: 72.2 fL — AB (ref 78.0–100.0)
Platelets: 210 10*3/uL (ref 150–400)
RBC: 4.82 MIL/uL (ref 3.87–5.11)
RDW: 14.4 % (ref 11.5–15.5)
WBC: 5.9 10*3/uL (ref 4.0–10.5)

## 2017-01-12 LAB — TYPE AND SCREEN
ABO/RH(D): B POS
Antibody Screen: NEGATIVE

## 2017-01-12 LAB — ABO/RH: ABO/RH(D): B POS

## 2017-01-12 MED ORDER — OXYTOCIN BOLUS FROM INFUSION
500.0000 mL | Freq: Once | INTRAVENOUS | Status: AC
  Administered 2017-01-12: 500 mL via INTRAVENOUS

## 2017-01-12 MED ORDER — FLEET ENEMA 7-19 GM/118ML RE ENEM: 1.0000 | ENEMA | RECTAL | Status: DC | PRN

## 2017-01-12 MED ORDER — OXYCODONE-ACETAMINOPHEN 5-325 MG PO TABS: 1.0000 | ORAL_TABLET | ORAL | Status: DC | PRN

## 2017-01-12 MED ORDER — LIDOCAINE HCL (PF) 1 % IJ SOLN
INTRAMUSCULAR | Status: DC | PRN
  Administered 2017-01-12: 3 mL
  Administered 2017-01-12: 5 mL
  Administered 2017-01-12: 2 mL

## 2017-01-12 MED ORDER — PHENYLEPHRINE 40 MCG/ML (10ML) SYRINGE FOR IV PUSH (FOR BLOOD PRESSURE SUPPORT): 80.0000 ug | PREFILLED_SYRINGE | INTRAVENOUS | Status: DC | PRN

## 2017-01-12 MED ORDER — EPHEDRINE 5 MG/ML INJ
10.0000 mg | INTRAVENOUS | Status: DC | PRN
  Filled 2017-01-12: qty 2

## 2017-01-12 MED ORDER — LACTATED RINGERS IV SOLN
INTRAVENOUS | Status: DC
  Administered 2017-01-12: 21:00:00 via INTRAVENOUS

## 2017-01-12 MED ORDER — LACTATED RINGERS IV SOLN: 500.0000 mL | INTRAVENOUS | Status: DC | PRN

## 2017-01-12 MED ORDER — BUTORPHANOL TARTRATE 1 MG/ML IJ SOLN
INTRAMUSCULAR | Status: AC
  Filled 2017-01-12: qty 1

## 2017-01-12 MED ORDER — OXYTOCIN 40 UNITS IN LACTATED RINGERS INFUSION - SIMPLE MED
2.5000 [IU]/h | INTRAVENOUS | Status: DC
  Filled 2017-01-12: qty 1000

## 2017-01-12 MED ORDER — OXYCODONE-ACETAMINOPHEN 5-325 MG PO TABS: 2.0000 | ORAL_TABLET | ORAL | Status: DC | PRN

## 2017-01-12 MED ORDER — SOD CITRATE-CITRIC ACID 500-334 MG/5ML PO SOLN: 30.0000 mL | ORAL | Status: DC | PRN

## 2017-01-12 MED ORDER — ONDANSETRON HCL 4 MG/2ML IJ SOLN: 4.0000 mg | Freq: Four times a day (QID) | INTRAMUSCULAR | Status: DC | PRN

## 2017-01-12 MED ORDER — LACTATED RINGERS IV SOLN: 500.0000 mL | Freq: Once | INTRAVENOUS | Status: DC

## 2017-01-12 MED ORDER — PHENYLEPHRINE 40 MCG/ML (10ML) SYRINGE FOR IV PUSH (FOR BLOOD PRESSURE SUPPORT)
80.0000 ug | PREFILLED_SYRINGE | INTRAVENOUS | Status: DC | PRN
  Filled 2017-01-12: qty 5

## 2017-01-12 MED ORDER — ACETAMINOPHEN 325 MG PO TABS: 650.0000 mg | ORAL_TABLET | ORAL | Status: DC | PRN

## 2017-01-12 MED ORDER — DIPHENHYDRAMINE HCL 50 MG/ML IJ SOLN: 12.5000 mg | INTRAMUSCULAR | Status: DC | PRN

## 2017-01-12 MED ORDER — EPHEDRINE 5 MG/ML INJ: 10.0000 mg | INTRAVENOUS | Status: DC | PRN

## 2017-01-12 MED ORDER — LIDOCAINE HCL (PF) 1 % IJ SOLN
30.0000 mL | INTRAMUSCULAR | Status: DC | PRN
  Filled 2017-01-12: qty 30

## 2017-01-12 MED ORDER — PHENYLEPHRINE 40 MCG/ML (10ML) SYRINGE FOR IV PUSH (FOR BLOOD PRESSURE SUPPORT)
80.0000 ug | PREFILLED_SYRINGE | INTRAVENOUS | Status: DC | PRN
  Filled 2017-01-12: qty 10
  Filled 2017-01-12: qty 5
  Filled 2017-01-12: qty 10

## 2017-01-12 MED ORDER — FENTANYL 2.5 MCG/ML BUPIVACAINE 1/10 % EPIDURAL INFUSION (WH - ANES)
14.0000 mL/h | INTRAMUSCULAR | Status: DC | PRN
  Administered 2017-01-12: 14 mL/h via EPIDURAL
  Filled 2017-01-12: qty 100

## 2017-01-12 MED ORDER — BUTORPHANOL TARTRATE 1 MG/ML IJ SOLN
1.0000 mg | INTRAMUSCULAR | Status: DC | PRN
  Administered 2017-01-12: 1 mg via INTRAVENOUS

## 2017-01-12 NOTE — Anesthesia Preprocedure Evaluation (Signed)
Anesthesia Evaluation  Patient identified by MRN, date of birth, ID band Patient awake    Reviewed: Allergy & Precautions, Patient's Chart, lab work & pertinent test results  Airway Mallampati: II  TM Distance: >3 FB Neck ROM: Full    Dental   Pulmonary neg pulmonary ROS,    breath sounds clear to auscultation       Cardiovascular negative cardio ROS   Rhythm:Regular Rate:Normal     Neuro/Psych negative neurological ROS     GI/Hepatic negative GI ROS, Neg liver ROS,   Endo/Other  negative endocrine ROS  Renal/GU negative Renal ROS     Musculoskeletal   Abdominal   Peds  Hematology negative hematology ROS (+)   Anesthesia Other Findings   Reproductive/Obstetrics (+) Pregnancy                             Lab Results  Component Value Date   WBC 5.9 01/12/2017   HGB 11.6 (L) 01/12/2017   HCT 34.8 (L) 01/12/2017   MCV 72.2 (L) 01/12/2017   PLT 210 01/12/2017    Anesthesia Physical Anesthesia Plan  ASA: II  Anesthesia Plan: Epidural   Post-op Pain Management:    Induction:   Airway Management Planned: Natural Airway  Additional Equipment:   Intra-op Plan:   Post-operative Plan:   Informed Consent: I have reviewed the patients History and Physical, chart, labs and discussed the procedure including the risks, benefits and alternatives for the proposed anesthesia with the patient or authorized representative who has indicated his/her understanding and acceptance.     Plan Discussed with:   Anesthesia Plan Comments:         Anesthesia Quick Evaluation

## 2017-01-12 NOTE — Progress Notes (Signed)
Pt more uncomfortable.  S/p IV stadol x 1, requesting epidural.  FHT cat 1 Toco Q2-3 Cvx 4/90/0  A/P:  Epidural  Exp mngt

## 2017-01-12 NOTE — Progress Notes (Signed)
Pt comfortable w/ epidural  FHT good variability w/ scalp stim Toco q2-3 Cvx 5/C/0  A/P:  Exp mngt

## 2017-01-12 NOTE — H&P (Signed)
[place] Katrinka BlazingSmith is a 26 y.o. G2P0 @ 38+4 wks female presenting with c/o ctx & spotting.  Pt was evaluated in office and noted to be uncomfortable w/ ctx - sent to L&D.  Pt reports uncomfortable ctx continue Q3-485min.  No LOF.    OB History    Gravida Para Term Preterm AB Living   2       1 0   SAB TAB Ectopic Multiple Live Births     1           Past Medical History:  Diagnosis Date  . HSV infection    Past Surgical History:  Procedure Laterality Date  . INDUCED ABORTION     Family History: family history includes Hypertension in her father. Social History:  reports that she has never smoked. She has never used smokeless tobacco. She reports that she does not drink alcohol or use drugs.     Maternal Diabetes: No Genetic Screening: Normal Maternal Ultrasounds/Referrals: declined Fetal Ultrasounds or other Referrals:  Other: right fetal pyelectasis (9mm), absent nasal bone, EIF.  Referral to MFM made Maternal Substance Abuse:  No Significant Maternal Medications:  Meds include: Other: valtrex Significant Maternal Lab Results:  None Other Comments:  None  ROS History Dilation: 3 Effacement (%): 100 Exam by:: Ritaj Dullea Blood pressure 103/71, pulse 93, temperature 98.4 F (36.9 C), temperature source Oral, resp. rate 18, height 5\' 3"  (1.6 m), weight 145 lb (65.8 kg), last menstrual period 04/17/2016. Exam Physical Exam  Gen - NAD Abd - gravid, NT  EFW 5.5# Ext - NT, no edema Cvx 3/90/0 AROM - clear, scant  Prenatal labs: ABO, Rh:   Antibody:   Rubella:   RPR:    HBsAg:    HIV:    GBS:   neg  Assessment/Plan: Admit Epidural prn   Laneshia Pina 01/12/2017, 1:57 PM

## 2017-01-12 NOTE — Progress Notes (Signed)
SVD of viable female infant w/ apgars of 8,9.  Placenta delivered spontaneous w/ 3VC.   2nd degree lac repaired w/ 3-0 vicryl rapide.  Fundus firm.  EBL 250cc .

## 2017-01-12 NOTE — Anesthesia Pain Management Evaluation Note (Signed)
  CRNA Pain Management Visit Note  Patient: Melanie Huang, 26 y.o., female  "Hello [facility]. We have an anesthesia team available at all times to provide care throughout the hospital, including epidural management and anesthesia for C-section. I don't know your plan for the delivery whether it a natural birth, water birth, IV sedation, nitrous supplementation, doula or epidural, but we want to meet your pain goals."   1.Was your pain managed to your expectations on prior hospitalizations?   No prior hospitalizations  2.What is your expectation for pain management during this hospitalization?     Labor support without medications, Epidural and IV pain meds  3.How can we help you reach that goal? flexible  Record the patient's initial score and the patient's pain goal.   Pain: 5  Pain Goal: 8 [facility] wants you to be able to say your pain was always managed very well.  Fradel Baldonado 01/12/2017

## 2017-01-12 NOTE — Progress Notes (Signed)
Pt comfortable w/ epidural.  Feeling pressure  FHT cat 1 Toco Q1-2 Cvx c/c/+1  A/P:  Start pushing in 20-30 min

## 2017-01-12 NOTE — Anesthesia Procedure Notes (Signed)
Epidural Patient location during procedure: OB Start time: 01/12/2017 5:18 PM End time: 01/12/2017 5:25 PM  Staffing Anesthesiologist: Marcene DuosFITZGERALD, Takiera Mayo Performed: anesthesiologist   Preanesthetic Checklist Completed: patient identified, site marked, surgical consent, pre-op evaluation, timeout performed, IV checked, risks and benefits discussed and monitors and equipment checked  Epidural Patient position: sitting Prep: site prepped and draped and DuraPrep Patient monitoring: continuous pulse ox and blood pressure Approach: midline Location: L4-L5 Injection technique: LOR air  Needle:  Needle type: Tuohy  Needle gauge: 17 G Needle length: 9 cm and 9 Needle insertion depth: 4 cm Catheter type: closed end flexible Catheter size: 19 Gauge Catheter at skin depth: 9 cm Test dose: negative  Assessment Events: blood not aspirated, injection not painful, no injection resistance, negative IV test and no paresthesia

## 2017-01-13 LAB — CBC
HEMATOCRIT: 29.2 % — AB (ref 36.0–46.0)
HEMOGLOBIN: 9.8 g/dL — AB (ref 12.0–15.0)
MCH: 24 pg — AB (ref 26.0–34.0)
MCHC: 33.6 g/dL (ref 30.0–36.0)
MCV: 71.6 fL — ABNORMAL LOW (ref 78.0–100.0)
Platelets: 200 10*3/uL (ref 150–400)
RBC: 4.08 MIL/uL (ref 3.87–5.11)
RDW: 14.2 % (ref 11.5–15.5)
WBC: 14.1 10*3/uL — ABNORMAL HIGH (ref 4.0–10.5)

## 2017-01-13 LAB — RPR: RPR Ser Ql: NONREACTIVE

## 2017-01-13 MED ORDER — DIBUCAINE 1 % RE OINT: 1.0000 "application " | TOPICAL_OINTMENT | RECTAL | Status: DC | PRN

## 2017-01-13 MED ORDER — SENNOSIDES-DOCUSATE SODIUM 8.6-50 MG PO TABS: 2.0000 | ORAL_TABLET | ORAL | Status: DC

## 2017-01-13 MED ORDER — MEDROXYPROGESTERONE ACETATE 150 MG/ML IM SUSP: 150.0000 mg | INTRAMUSCULAR | Status: DC | PRN

## 2017-01-13 MED ORDER — ONDANSETRON HCL 4 MG/2ML IJ SOLN
4.0000 mg | INTRAMUSCULAR | Status: DC | PRN
Start: 2017-01-13 — End: 2017-01-14

## 2017-01-13 MED ORDER — ONDANSETRON HCL 4 MG PO TABS: 4.0000 mg | ORAL_TABLET | ORAL | Status: DC | PRN

## 2017-01-13 MED ORDER — BENZOCAINE-MENTHOL 20-0.5 % EX AERO
1.0000 "application " | INHALATION_SPRAY | CUTANEOUS | Status: DC | PRN
  Administered 2017-01-13: 1 via TOPICAL
  Filled 2017-01-13: qty 56

## 2017-01-13 MED ORDER — SIMETHICONE 80 MG PO CHEW: 80.0000 mg | CHEWABLE_TABLET | ORAL | Status: DC | PRN

## 2017-01-13 MED ORDER — OXYCODONE HCL 5 MG PO TABS: 10.0000 mg | ORAL_TABLET | ORAL | Status: DC | PRN

## 2017-01-13 MED ORDER — MEASLES, MUMPS & RUBELLA VAC ~~LOC~~ INJ
0.5000 mL | INJECTION | Freq: Once | SUBCUTANEOUS | Status: DC
  Filled 2017-01-13: qty 0.5

## 2017-01-13 MED ORDER — PRENATAL MULTIVITAMIN CH
1.0000 | ORAL_TABLET | Freq: Every day | ORAL | Status: DC
  Filled 2017-01-13 (×2): qty 1

## 2017-01-13 MED ORDER — IBUPROFEN 600 MG PO TABS
600.0000 mg | ORAL_TABLET | Freq: Four times a day (QID) | ORAL | Status: DC
  Administered 2017-01-13 – 2017-01-14 (×6): 600 mg via ORAL
  Filled 2017-01-13 (×6): qty 1

## 2017-01-13 MED ORDER — OXYCODONE HCL 5 MG PO TABS: 5.0000 mg | ORAL_TABLET | ORAL | Status: DC | PRN

## 2017-01-13 MED ORDER — TETANUS-DIPHTH-ACELL PERTUSSIS 5-2.5-18.5 LF-MCG/0.5 IM SUSP: 0.5000 mL | Freq: Once | INTRAMUSCULAR | Status: DC

## 2017-01-13 MED ORDER — DIPHENHYDRAMINE HCL 25 MG PO CAPS: 25.0000 mg | ORAL_CAPSULE | Freq: Four times a day (QID) | ORAL | Status: DC | PRN

## 2017-01-13 MED ORDER — COCONUT OIL OIL: 1.0000 "application " | TOPICAL_OIL | Status: DC | PRN

## 2017-01-13 MED ORDER — WITCH HAZEL-GLYCERIN EX PADS: 1.0000 "application " | MEDICATED_PAD | CUTANEOUS | Status: DC | PRN

## 2017-01-13 MED ORDER — ACETAMINOPHEN 325 MG PO TABS: 650.0000 mg | ORAL_TABLET | ORAL | Status: DC | PRN

## 2017-01-13 NOTE — Progress Notes (Signed)
Post Partum Day 1 Subjective: no complaints  Objective: Blood pressure (!) 98/59, pulse 86, temperature 98.6 F (37 C), temperature source Oral, resp. rate 18, height 5\' 3"  (1.6 m), weight 65.8 kg (145 lb), last menstrual period 04/17/2016, SpO2 100 %, unknown if currently breastfeeding.  Physical Exam:  General: alert, cooperative and appears stated age Lochia: appropriate Uterine Fundus: firm Incision: healing well DVT Evaluation: No evidence of DVT seen on physical exam. Negative Homan's sign. No cords or calf tenderness. No significant calf/ankle edema.   Recent Labs  01/12/17 1331 01/13/17 0510  HGB 11.6* 9.8*  HCT 34.8* 29.2*    Assessment/Plan: Plan for discharge tomorrow and Circumcision prior to discharge - currently not ready as has not peed and needs imaging of his lower extremities per peds.    LOS: 1 day   Ranae Pilalise Jennifer Ceairra Mccarver 01/13/2017, 9:51 AM

## 2017-01-13 NOTE — Anesthesia Postprocedure Evaluation (Signed)
Anesthesia Post Note  Patient: [place] Carrithers  Procedure(s) Performed: * No procedures listed *  Patient location during evaluation: Mother Baby Anesthesia Type: Epidural Level of consciousness: awake and alert and oriented Pain management: satisfactory to patient Vital Signs Assessment: post-procedure vital signs reviewed and stable Respiratory status: spontaneous breathing and nonlabored ventilation Cardiovascular status: stable Postop Assessment: no headache, no backache, no signs of nausea or vomiting, adequate PO intake and patient able to bend at knees (patient up walking) Anesthetic complications: no        Last Vitals:  Vitals:   01/13/17 0601 01/13/17 0840  BP: 100/63 (!) 98/59  Pulse: 97 86  Resp:  18  Temp: 37.9 C 37 C    Last Pain:  Vitals:   01/13/17 1306  TempSrc:   PainSc: 3    Pain Goal:                 Madison HickmanGREGORY,Joanthony Hamza

## 2017-01-14 MED ORDER — IBUPROFEN 600 MG PO TABS: 600.0000 mg | ORAL_TABLET | Freq: Four times a day (QID) | ORAL | 0 refills | Status: AC

## 2017-01-14 NOTE — Discharge Summary (Signed)
Obstetric Discharge Summary Reason for Admission: onset of labor Prenatal Procedures: none Intrapartum Procedures: spontaneous vaginal delivery Postpartum Procedures: none Complications-Operative and Postpartum: none Hemoglobin  Date Value Ref Range Status  01/13/2017 9.8 (L) 12.0 - 15.0 g/dL Final   HCT  Date Value Ref Range Status  01/13/2017 29.2 (L) 36.0 - 46.0 % Final    Physical Exam:  General: alert Lochia: appropriate Uterine Fundus: firm Incision: healing well DVT Evaluation: No evidence of DVT seen on physical exam.  Discharge Diagnoses: Term Pregnancy-delivered  Discharge Information: Date: 01/14/2017 Activity: pelvic rest Diet: routine Medications: PNV and Ibuprofen Condition: stable Instructions: refer to practice specific booklet Discharge to: home Follow-up Information    [place] Schedule an appointment as soon as possible for a visit in 6 week(s).   Contact information: [address] [place] [address] [phone]           Newborn Data: Live born female  Birth Weight: 6 lb 10.4 oz (3016 g) APGAR: 8, 9  Home with mother.  Meriel PicaHOLLAND,Dasani Crear M 01/14/2017, 9:41 AM

## 2017-01-18 ENCOUNTER — Inpatient Hospital Stay (HOSPITAL_COMMUNITY): Payer: BC Managed Care – PPO

## 2017-11-30 IMAGING — US US MFM OB DETAIL+14 WK
1 series · 13 of 28 positions shown · non-contrast
Comparison: none

[Series 1: us mfm ob detail+14 wk · 13 of 118 slices shown]
[im 5/118]
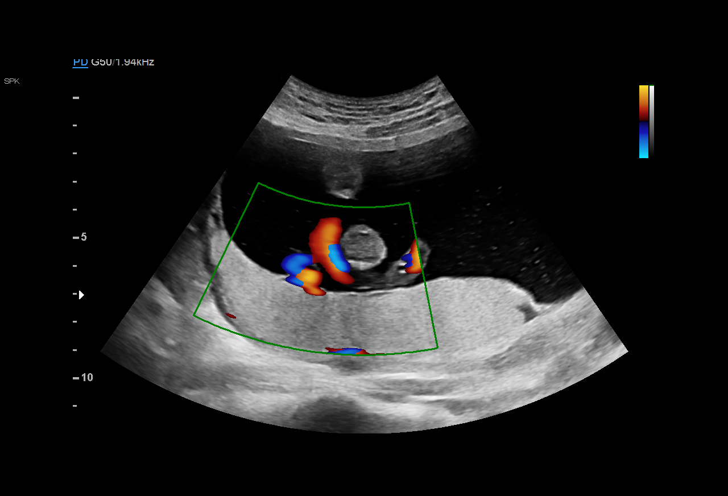
[im 14/118]
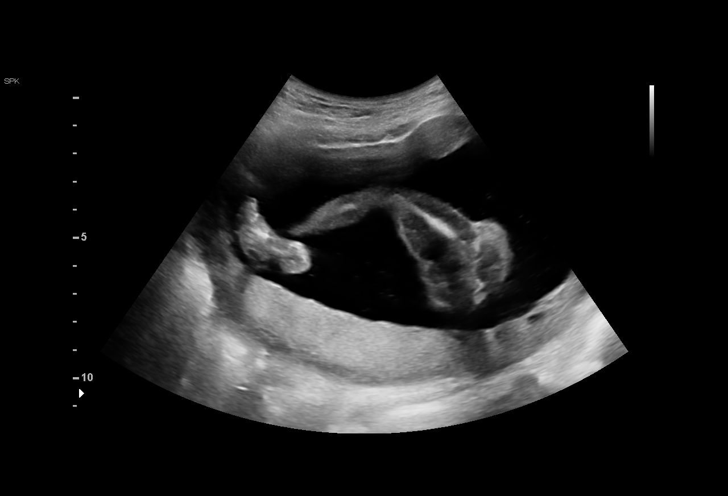
[im 22/118]
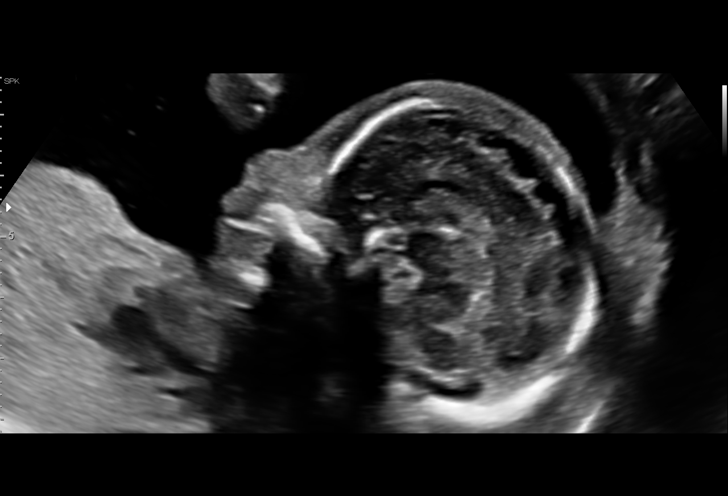
[im 31/118]
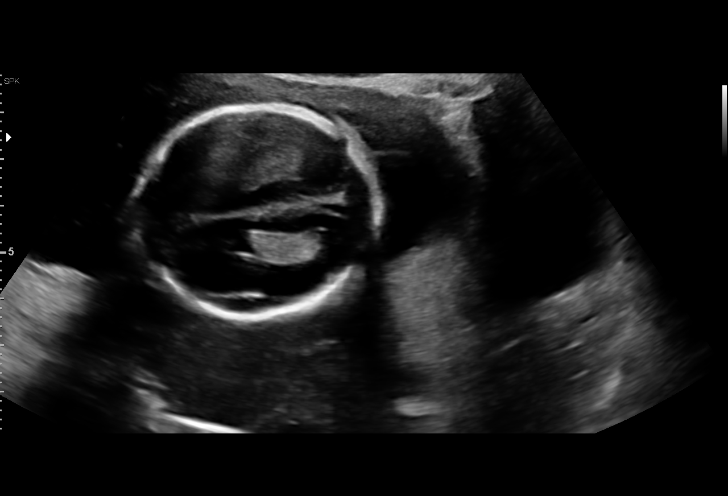
[im 40/118]
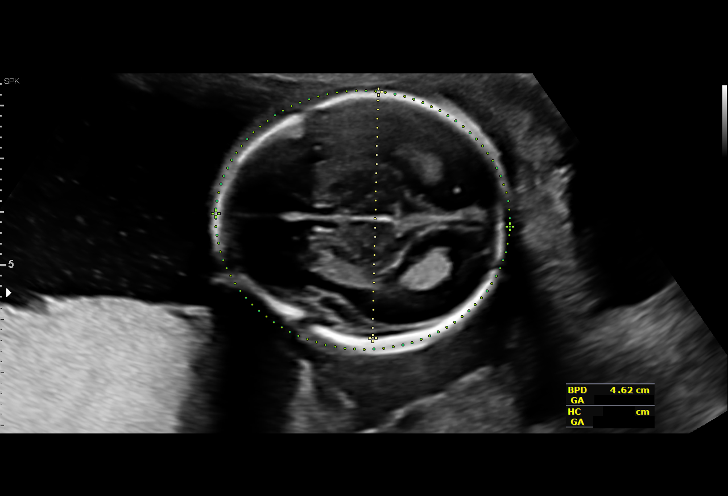
[im 48/118]
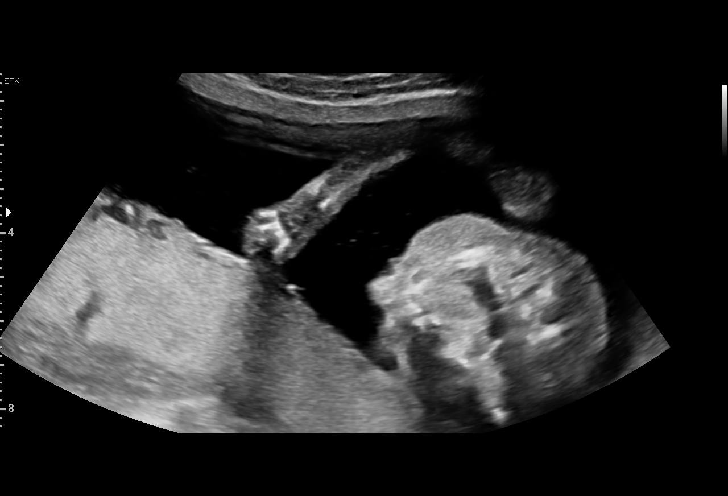
[im 61/118]
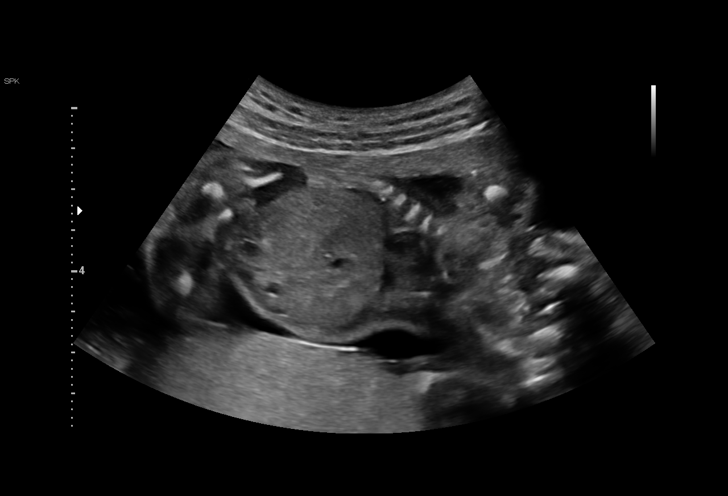
[im 70/118]
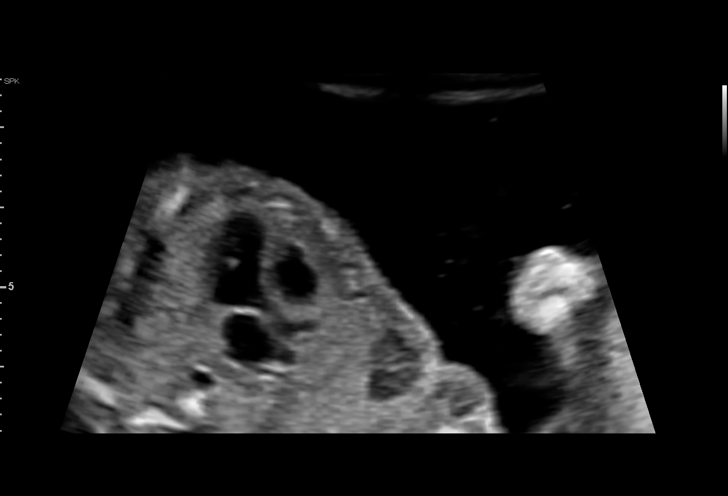
[im 79/118]
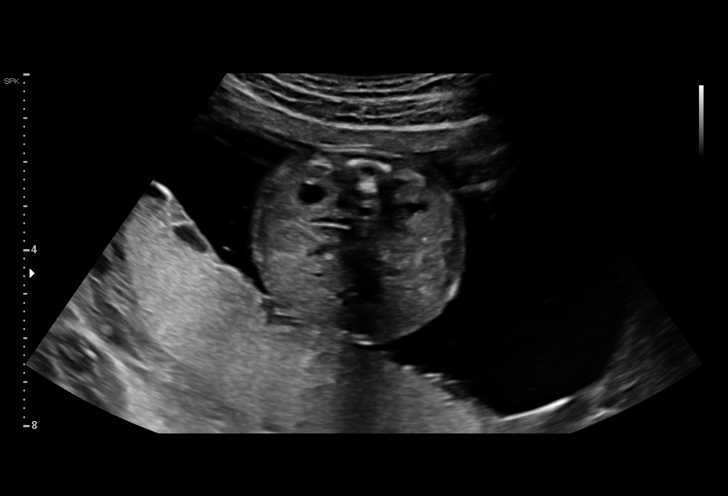
[im 87/118]
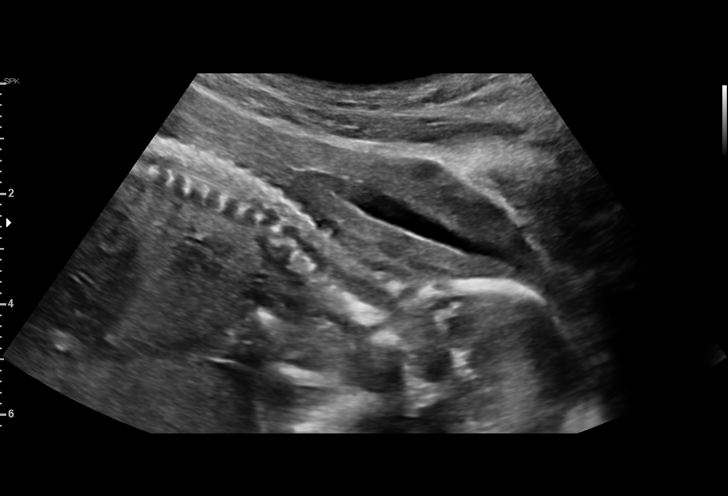
[im 96/118]
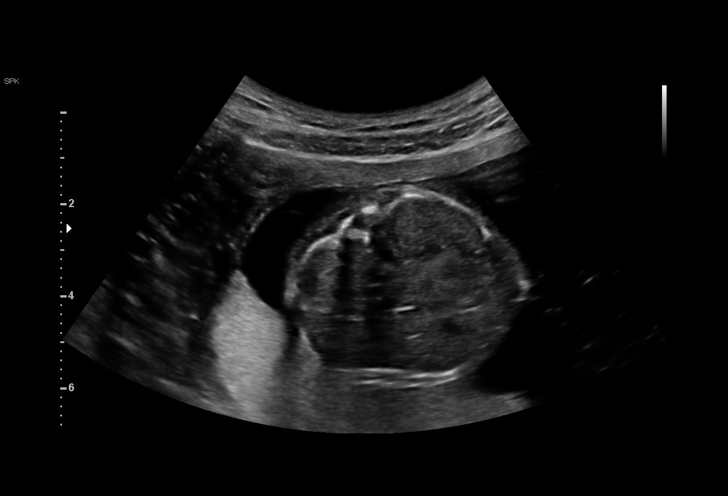
[im 105/118]
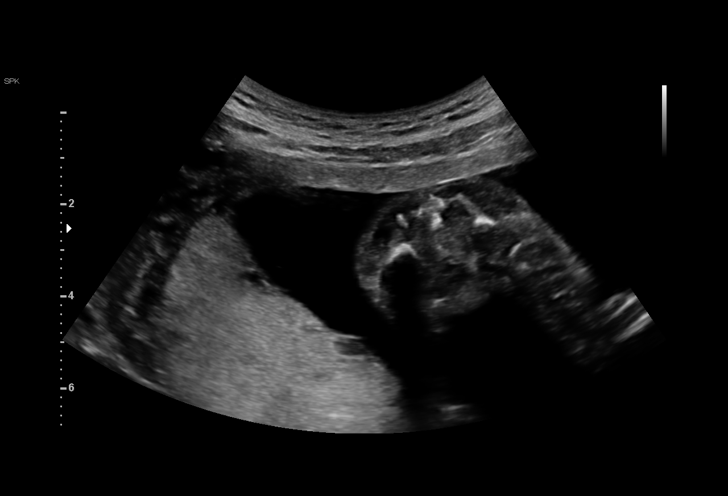
[im 113/118]
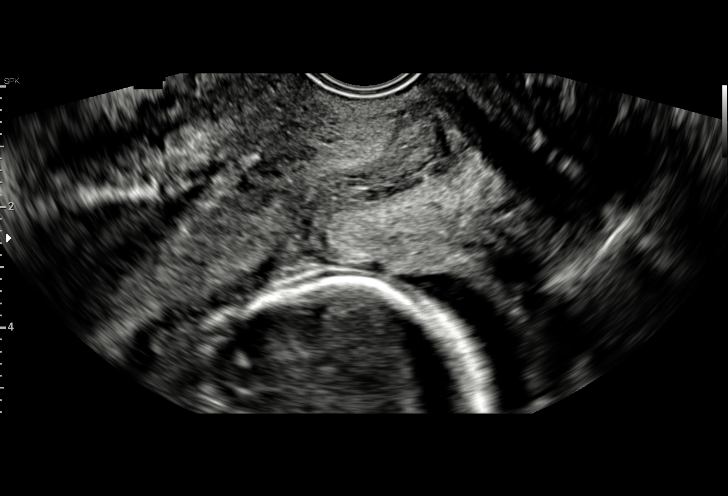

[13 of 28 positions shown; findings below may reference images not displayed]

pm)

[facility]; [HOSPITAL]

1  [facility]            [id number]      [id number]     [id number]
2  [facility]            [id number]      [id number]     [id number]
Indications

19 weeks gestation of pregnancy
Encounter for antenatal screening for
malformations
OB History

Blood Type:            Height:  5'4"   Weight (lb):  125       BMI:
Gravidity:    2         Term:   0        Prem:   0        SAB:   1
TOP:          0       Ectopic:  0        Living: 0
Fetal Evaluation

Num Of Fetuses:     1
Fetal Heart         142
Rate(bpm):
Cardiac Activity:   Observed
Presentation:       Cephalic
Placenta:           Posterior, above cervical os
P. Cord Insertion:  Visualized, central

Amniotic Fluid
AFI FV:      Subjectively within normal limits

Largest Pocket(cm)
4.98
Biometry

BPD:      46.4  mm     G. Age:  20w 0d         57  %    CI:        78.37   %    70 - 86
FL/HC:      17.9   %    16.8 -
HC:      165.8  mm     G. Age:  19w 2d         19  %    HC/AC:      1.04        1.09 -
AC:      158.9  mm     G. Age:  21w 0d         80  %    FL/BPD:     64.0   %
FL:       29.7  mm     G. Age:  19w 1d         20  %    FL/AC:      18.7   %    20 - 24
HUM:      28.4  mm     G. Age:  19w 1d         35  %
CER:      19.1  mm     G. Age:  18w 4d         13  %
NFT:       4.4  mm

CM:        3.1  mm

Est. FW:     330  gm    0 lb 12 oz      52  %
Gestational Age

LMP:           19w 6d        Date:  04/17/16                 EDD:   01/22/17
U/S Today:     19w 6d                                        EDD:   01/22/17
Best:          19w 6d     Det. By:  LMP  (04/17/16)          EDD:   01/22/17
Anatomy

Cranium:               Appears normal         Aortic Arch:            Appears normal
Cavum:                 Appears normal         Ductal Arch:            Appears normal
Ventricles:            Appears normal         Diaphragm:              Appears normal
Choroid Plexus:        Appears normal         Stomach:                Appears normal, left
sided
Cerebellum:            Appears normal         Abdomen:                Appears normal
Posterior Fossa:       Appears normal         Abdominal Wall:         Appears nml (cord
insert, abd wall)
Nuchal Fold:           Appears normal         Cord Vessels:           Appears normal (3
vessel cord)
Face:                  Absent nasal bone      Kidneys:                Bilat
pyelectasis,Rt 8
mm,Lt 8 mm
Lips:                  Appears normal         Bladder:                Appears normal
Thoracic:              Appears normal         Spine:                  Appears normal
Heart:                 Echogenic focus        Upper Extremities:      Appears normal
in LV
RVOT:                  Appears normal         Lower Extremities:      Appears normal
LVOT:                  Appears normal

Other:  EIF noted Appears to be male  Bil Pyelectasis  Absent nasal bone
Cervix Uterus Adnexa

Cervix
Length:           3.38  cm.
Normal appearance by transvaginal scan

Uterus
No abnormality visualized.

Left Ovary
Not visualized.

Right Ovary
Not visualized.

Cul De Sac:   No free fluid seen.

Adnexa:       No abnormality visualized.
Impression

Single IUP at 19w 6d
Multiple soft markers noted: absent nasal bone, bilateral
urinary tract dilation, echogenic bowel
Both the right and left renal pelvises measure 
 8mm.  Some
cental calyceal dilation is noted
The remainder of the fetal anatomy appears normal
Posterior placenta without previa
Normal amniotic fluid volume

Ultrasound findings and limitations were discussed in detail.
Reviewed the association of Down syndrome with the soft
markers that were appreciated. The patient declined
amniocentesis but is considering cell free fetal DNA testing.
Recommendations

See separate note from Genetics Counselor.
Recommend follow up in 4 weeks to reevaluate the fetal
kidneys.
